# Patient Record
Sex: Female | Born: 2000 | Race: Black or African American | Hispanic: No | Marital: Single | Smoking: Never smoker
Health system: Southern US, Community
[De-identification: ages and names within clinical notes are randomized; demographics above are authoritative.]

---
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
## 2019-01-02 IMAGING — CR DG CHEST 2V
2 series · 2 of 2 positions shown · non-contrast
Comparison: None.

CLINICAL DATA: Right-sided chest pain which is worsened with cough
and inspiration. Occasional shortness of breath. Duration of
symptoms 5 days. Nonsmoker.

EXAM:
CHEST  2 VIEW

[chest pa]
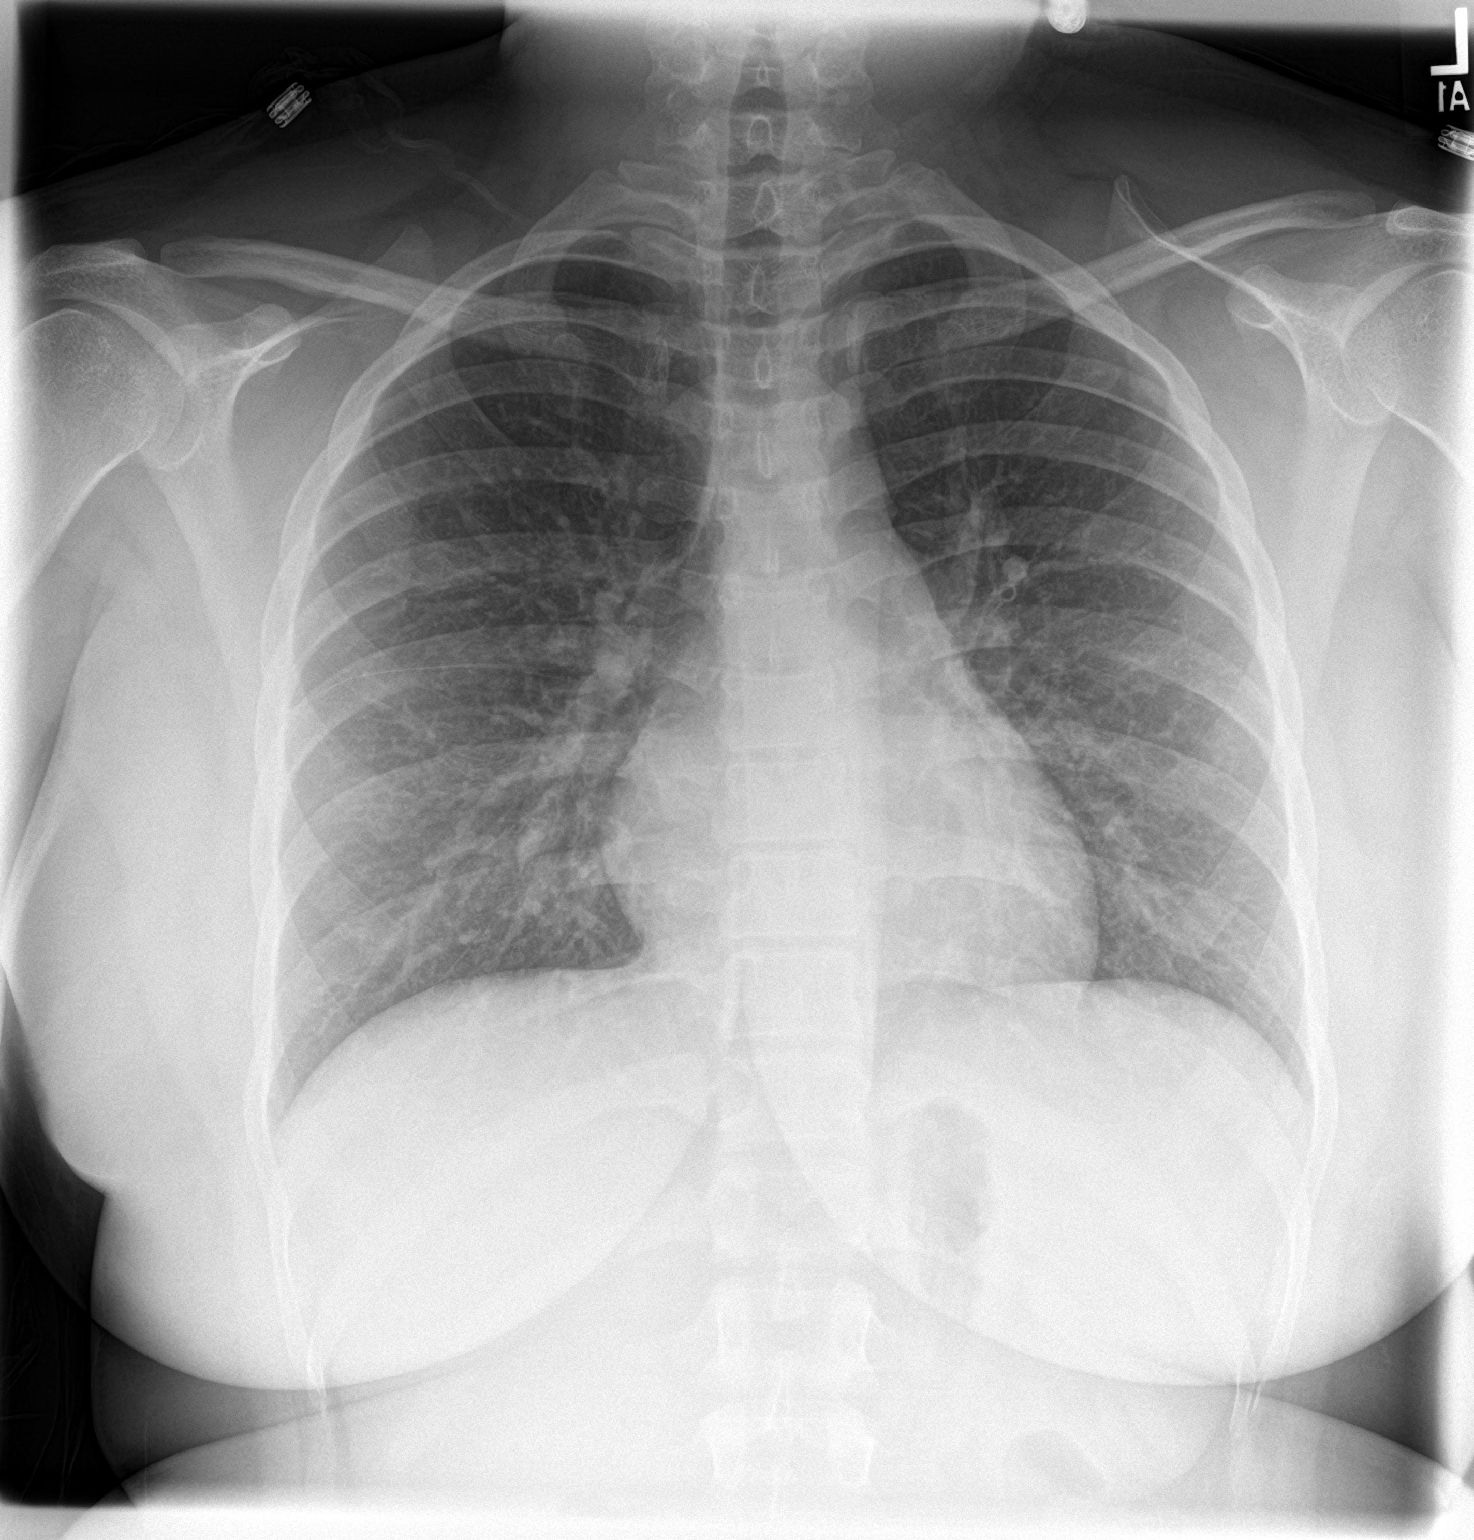

[chest lat]
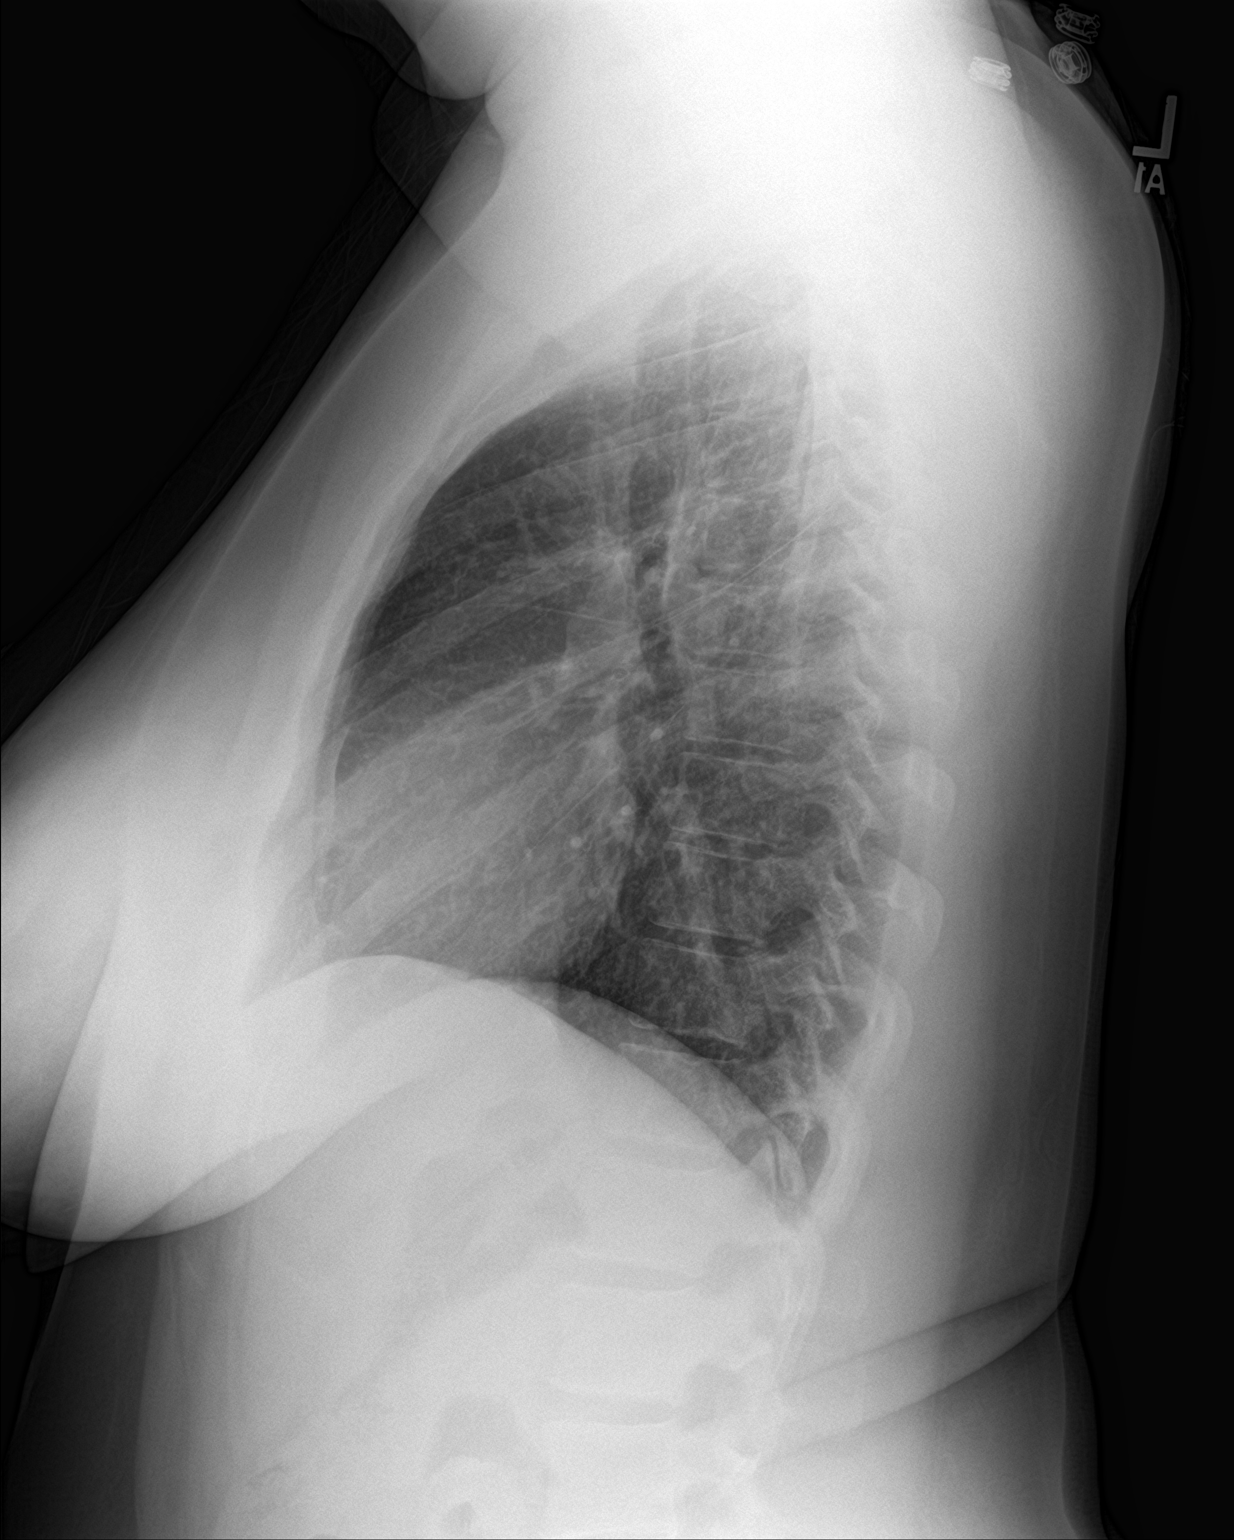

[2 of 2 positions shown; findings below may reference images not displayed]

FINDINGS: The lungs are adequately inflated. There is no focal infiltrate. The
interstitial markings are coarse. The heart and pulmonary
vascularity are normal. The mediastinum is normal in width. The bony
thorax exhibits no acute abnormality.
IMPRESSION: Mild interstitial prominence may reflect bronchitic change or
reactive airway disease. No pneumonia, CHF, nor other acute
cardiopulmonary abnormality.
